# Patient Record
Sex: Male | Born: 1945 | Race: White | Hispanic: No | Marital: Married | Smoking: Never smoker
Health system: Southern US, Community
[De-identification: ages and names within clinical notes are randomized; demographics above are authoritative.]

## PROBLEM LIST (undated history)

## (undated) DIAGNOSIS — I219 Acute myocardial infarction, unspecified: Secondary | ICD-10-CM

## (undated) DIAGNOSIS — E7800 Pure hypercholesterolemia, unspecified: Secondary | ICD-10-CM

## (undated) DIAGNOSIS — M1990 Unspecified osteoarthritis, unspecified site: Secondary | ICD-10-CM

## (undated) DIAGNOSIS — I10 Essential (primary) hypertension: Secondary | ICD-10-CM

## (undated) DIAGNOSIS — T4145XA Adverse effect of unspecified anesthetic, initial encounter: Secondary | ICD-10-CM

## (undated) DIAGNOSIS — T8859XA Other complications of anesthesia, initial encounter: Secondary | ICD-10-CM

## (undated) DIAGNOSIS — G4730 Sleep apnea, unspecified: Secondary | ICD-10-CM

## (undated) DIAGNOSIS — E119 Type 2 diabetes mellitus without complications: Secondary | ICD-10-CM

## (undated) HISTORY — PX: TOTAL HIP ARTHROPLASTY: SHX124

## (undated) HISTORY — PX: WISDOM TOOTH EXTRACTION: SHX21

---

## 1988-04-15 DIAGNOSIS — I219 Acute myocardial infarction, unspecified: Secondary | ICD-10-CM

## 1988-04-15 HISTORY — DX: Acute myocardial infarction, unspecified: I21.9

## 1988-05-16 HISTORY — PX: CORONARY ARTERY BYPASS GRAFT: SHX141

## 1998-04-15 HISTORY — PX: CATARACT EXTRACTION: SUR2

## 2007-09-21 ENCOUNTER — Ambulatory Visit: Payer: Self-pay | Admitting: General Practice

## 2007-10-05 ENCOUNTER — Inpatient Hospital Stay: Payer: Self-pay | Admitting: General Practice

## 2014-08-20 ENCOUNTER — Emergency Department
Admission: EM | Admit: 2014-08-20 | Discharge: 2014-08-20 | Disposition: A | Discharge status: Home / Self Care | Payer: Medicare Other | Attending: Emergency Medicine | Admitting: Emergency Medicine

## 2014-08-20 ENCOUNTER — Emergency Department: Payer: Medicare Other

## 2014-08-20 ENCOUNTER — Encounter: Payer: Self-pay | Admitting: Emergency Medicine

## 2014-08-20 DIAGNOSIS — Y92009 Unspecified place in unspecified non-institutional (private) residence as the place of occurrence of the external cause: Secondary | ICD-10-CM | POA: Insufficient documentation

## 2014-08-20 DIAGNOSIS — Y998 Other external cause status: Secondary | ICD-10-CM | POA: Insufficient documentation

## 2014-08-20 DIAGNOSIS — S42112A Displaced fracture of body of scapula, left shoulder, initial encounter for closed fracture: Secondary | ICD-10-CM | POA: Insufficient documentation

## 2014-08-20 DIAGNOSIS — S0990XA Unspecified injury of head, initial encounter: Secondary | ICD-10-CM | POA: Diagnosis present

## 2014-08-20 DIAGNOSIS — W06XXXA Fall from bed, initial encounter: Secondary | ICD-10-CM | POA: Diagnosis not present

## 2014-08-20 DIAGNOSIS — I10 Essential (primary) hypertension: Secondary | ICD-10-CM | POA: Diagnosis not present

## 2014-08-20 DIAGNOSIS — S0101XA Laceration without foreign body of scalp, initial encounter: Secondary | ICD-10-CM | POA: Diagnosis not present

## 2014-08-20 DIAGNOSIS — Y9389 Activity, other specified: Secondary | ICD-10-CM | POA: Diagnosis not present

## 2014-08-20 DIAGNOSIS — S0093XA Contusion of unspecified part of head, initial encounter: Secondary | ICD-10-CM

## 2014-08-20 DIAGNOSIS — E119 Type 2 diabetes mellitus without complications: Secondary | ICD-10-CM | POA: Diagnosis not present

## 2014-08-20 DIAGNOSIS — S0001XA Abrasion of scalp, initial encounter: Secondary | ICD-10-CM

## 2014-08-20 DIAGNOSIS — S42101A Fracture of unspecified part of scapula, right shoulder, initial encounter for closed fracture: Secondary | ICD-10-CM

## 2014-08-20 HISTORY — DX: Type 2 diabetes mellitus without complications: E11.9

## 2014-08-20 HISTORY — DX: Pure hypercholesterolemia, unspecified: E78.00

## 2014-08-20 HISTORY — DX: Essential (primary) hypertension: I10

## 2014-08-20 MED ORDER — OXYCODONE-ACETAMINOPHEN 5-325 MG PO TABS
ORAL_TABLET | ORAL | Status: AC
  Filled 2014-08-20: qty 1

## 2014-08-20 MED ORDER — ONDANSETRON 4 MG PO TBDP
ORAL_TABLET | ORAL | Status: AC
  Filled 2014-08-20: qty 1

## 2014-08-20 MED ORDER — ONDANSETRON 4 MG PO TBDP
4.0000 mg | ORAL_TABLET | Freq: Once | ORAL | Status: AC
  Administered 2014-08-20: 4 mg via ORAL

## 2014-08-20 MED ORDER — OXYCODONE-ACETAMINOPHEN 5-325 MG PO TABS: 1.0000 | ORAL_TABLET | Freq: Four times a day (QID) | ORAL | Status: AC | PRN

## 2014-08-20 MED ORDER — OXYCODONE-ACETAMINOPHEN 5-325 MG PO TABS
1.0000 | ORAL_TABLET | Freq: Once | ORAL | Status: AC
  Administered 2014-08-20: 1 via ORAL

## 2014-08-20 NOTE — ED Notes (Signed)
Patient presents to the ED post fall.  Patient states he was standing on a toolbox in a truck bed and he fell into the truck bed and then off the truck bed and onto the concrete of his driveway.  Patient is unsure of whether or not he passed out.  Patient hit the back of his head and there is a small amount of blood and swelling to area.  Patient is complaining of pain in his left shoulder.  Patient has abrasion to hand and left leg.  Sensation and movement is intact in left arm.  Patient is alert and oriented x 4.  Does not take blood thinners.

## 2014-08-20 NOTE — Discharge Instructions (Signed)
Blunt Trauma You have been evaluated for injuries. You have been examined and your caregiver has not found injuries serious enough to require hospitalization. It is common to have multiple bruises and sore muscles following an accident. These tend to feel worse for the first 24 hours. You will feel more stiffness and soreness over the next several hours and worse when you wake up the first morning after your accident. After this point, you should begin to improve with each passing day. The amount of improvement depends on the amount of damage done in the accident. Following your accident, if some part of your body does not work as it should, or if the pain in any area continues to increase, you should return to the Emergency Department for re-evaluation.  HOME CARE INSTRUCTIONS  Routine care for sore areas should include:  Ice to sore areas every 2 hours for 20 minutes while awake for the next 2 days.  Drink extra fluids (not alcohol).  Take a hot or warm shower or bath once or twice a day to increase blood flow to sore muscles. This will help you "limber up".  Activity as tolerated. Lifting may aggravate neck or back pain.  Only take over-the-counter or prescription medicines for pain, discomfort, or fever as directed by your caregiver. Do not use aspirin. This may increase bruising or increase bleeding if there are small areas where this is happening. SEEK IMMEDIATE MEDICAL CARE IF:  Numbness, tingling, weakness, or problem with the use of your arms or legs.  A severe headache is not relieved with medications.  There is a change in bowel or bladder control.  Increasing pain in any areas of the body.  Short of breath or dizzy.  Nauseated, vomiting, or sweating.  Increasing belly (abdominal) discomfort.  Blood in urine, stool, or vomiting blood.  Pain in either shoulder in an area where a shoulder strap would be.  Feelings of lightheadedness or if you have a fainting  episode. Sometimes it is not possible to identify all injuries immediately after the trauma. It is important that you continue to monitor your condition after the emergency department visit. If you feel you are not improving, or improving more slowly than should be expected, call your physician. If you feel your symptoms (problems) are worsening, return to the Emergency Department immediately. Document Released: 12/26/2000 Document Revised: 06/24/2011 Document Reviewed: 11/18/2007 [facility]Exit[facility] Patient Information 2015 [place], [place]. This information is not intended to replace advice given to you by your health care provider. Make sure you discuss any questions you have with your health care provider.  Scapular Fracture You have a fracture (break in bone) of your scapula. This is your shoulder blade. It is the large flat bone behind your shoulder. This is also the bone that makes up the ball and socket joint of your shoulder. Most of the time surgery is not required for injuries to this bone unless the socket of the shoulder joint is involved. DIAGNOSIS  Because of the severity of force usually required to break this bone, x-rays are often taken of other bones likely to be injured at the same time. X-rays of the hip, knee, and pelvis may be taken. Specialized x-rays (arteriograms) may be needed if there are injuries to large blood vessels associated with this injury. HOME CARE INSTRUCTIONS   Simple fractures of the scapula can be treated with a sling and swathe type of immobilization. This means the involved area is held in place by putting the arm in  a sling. A wrap is made around the upper arm with the sling holding the arm next to the chest. This may be removed for bathing as instructed by your caregiver.  Apply ice to the injury for 15-20 minutes 03-04 times per day. Put the ice in a plastic bag. Place a towel between the bag of ice and your skin, splint, or immobilization device.  Do not resume use  until instructed by your caregiver. Usually full rehabilitation (exercises to improve the injury site) will begin sometime after the sling and swathe are removed. Then begin use gradually as directed. Do not increase use to the point of pain. If pain develops, decrease use and continue the above measures. Slowly increase activities that do not cause discomfort until you gradually achieve normal use without pain.  Only take over-the-counter or prescription medicines for pain, discomfort, or fever as directed by your caregiver. SEEK IMMEDIATE MEDICAL CARE IF:   Your pain and swelling increase and is uncontrolled with medications.  You develop new, unexplained symptoms or an increase of the symptoms which brought you to your caregiver.  You develop shortness or breath or cough up blood.  You are unable to move your arm or fingers. You develop warmth and swelling in your affected arm.  You develop an unexplained temperature. Document Released: 04/01/2005 Document Revised: 06/24/2011 Document Reviewed: 02/21/2006 [facility] Sew[facility]leyExitCare Patient Information 2015 [place]ExitCare, [place]LL[place]. This information is not intended to replace advice given to you by your health care provider. Make sure you discuss any questions you have with your health care provider.   Wear the sling while out of bed.  Follow-up with Dr. Joice LoftsPoggi next week for further care.  Take the prescription meds as directed.  Apply ice for comfort.

## 2014-08-20 NOTE — ED Provider Notes (Signed)
[facility] Emergency [facility] Provider Note ?____________________________________________ ? Time seen: 1745 ? I have reviewed the triage vital signs and the nursing notes.  ________ HISTORY ? Chief Complaint Fall    HPI  Foy Pollie MeyerMcIntyre is a 69 y.o. male who presents to the ED status post fall at home today. He describes it on a toolbox and the back of a truck bed, when he fell backwards slipping out of the truck bed and landing on the driveway. He hit the back of his head and noted immediate pain to the posterior left shoulder. He also notes some decrease movement to left arm secondary to pain. He rates pain at 10 currently.  Past Medical History  Diagnosis Date  . Hypertension   . Diabetes mellitus without complication   . High cholesterol     There are no active problems to display for this patient.  ? Past Surgical History  Procedure Laterality Date  . Cataract extraction  2000  . Total hip arthroplasty     Allergies Review of patient's allergies indicates no known allergies. ? No family history on file. ? Social History History  Substance Use Topics  . Smoking status: Never Smoker   . Smokeless tobacco: Never Used  . Alcohol Use: No    Review of Systems  Constitutional: Negative for fever. HEENT: Negative for head trauma, visual changes, sore throat. Cardiovascular: Negative for chest pain. Respiratory: Negative for shortness of breath. Musculoskeletal: Negative for back pain. Skin: Negative for rash. Neurological: Negative for headaches, focal weakness or numbness.  10-point ROS otherwise negative.  ____________________________________________   PHYSICAL EXAM:  VITAL SIGNS: ED Triage Vitals  Enc Vitals Group     BP 08/20/14 1509 139/81 mmHg     Pulse Rate 08/20/14 1509 77     Resp 08/20/14 1509 20     Temp 08/20/14 1509 98.6 F (37 C)     Temp Source 08/20/14 1509 Oral     SpO2 08/20/14 1509 100 %     Weight  08/20/14 1509 239 lb (108.41 kg)     Height 08/20/14 1509 5\' 7"  (1.702 m)     Head Cir --      Peak Flow --      Pain Score 08/20/14 1510 9     Pain Loc --      Pain Edu? --      Excl. in GC? --     Constitutional: Alert and oriented. Well appearing and in no distress. HEENT:Normocephalic and superficial abrasion noted to the left parietal scalp. Small less than half centimeter laceration noted to the scalp. PERRL. Normal extraocular movements.  No congestion/rhinnorhea. Mucous membranes are moist. Neck: Supple. No cervical lymphadenopathy. Cardiovascular: Normal rate, regular rhythm. No murmurs, rubs, or gallops. Normal and symmetric distal pulses are present in all extremities.  Respiratory: Normal respiratory effort without tachypnea. Breath sounds are clear and equal bilaterally. No wheezes/rales/rhonchi. Gastrointestinal: Soft and nontender. No distention. No abdominal bruits. There is no CVA tenderness. Musculoskeletal: Left shoulder without deformity, left posterior scapula with superficial abrasion, patient is noted to be tender to palpation over the left scapular spine. Decreased left shoulder range of motion secondary to pain. No joint effusions.  No lower extremity tenderness nor edema. Neurologic:  Normal speech and language. CN II-XII grossly intact. No gait instability. Skin:  Skin is warm, dry and intact. No rash noted. Psychiatric: Mood and affect are normal. Patient exhibits appropriate insight and judgment.  ___________  RADIOLOGY  Right Shoulder  IMPRESSION: Mildly displaced scapular body fracture. This is in the body of the scapula superior to the spine and approaches but does not clearly extend into the base of the coronoid.Glenohumeral joint appears located.  Maximal displacement measures 4 mm.  CT Head/C-spine  IMPRESSION: 1. No skull fracture or intracranial hemorrhage. 2. No cervical spine fracture or subluxation. 3. Mild diffuse cerebral atrophy. 4.  Cervical spine degenerative changes. 5. Bilateral carotid artery atheromatous calcifications. _____________ PROCEDURES ? Procedure(s) performed: None  Critical Care performed: None  ________ Current Outpatient Rx  Name  Route  Sig  Dispense  Refill  . oxyCODONE-acetaminophen (ROXICET) 5-325 MG per tablet   Oral   Take 1 tablet by mouth every 6 (six) hours as needed for moderate pain or severe pain.   20 tablet   0    ______________________________________________ INITIAL IMPRESSION / ASSESSMENT AND PLAN / ED COURSE ? Radiology results to patient & family.  Discussed follow-up care plan with Dr. Joice LoftsPoggi.  Will see patient in the office next week for non-surgical management.  Pertinent labs & imaging results that were available during my care of the patient were reviewed by me and considered in my medical decision making (see chart for details).   ____________________________________________ FINAL CLINICAL IMPRESSION(S) / ED DIAGNOSES?  Final diagnoses:  Scapula fracture, right, closed, initial encounter  Head contusion, initial encounter  Scalp abrasion, initial encounter      Lissa HoardJenise V Bacon Mikaia Janvier, PA-C 08/21/14 0047  Governor Rooksebecca Lord, MD 08/24/14 1258

## 2014-08-26 ENCOUNTER — Other Ambulatory Visit: Payer: Self-pay | Admitting: Orthopedic Surgery

## 2014-08-26 DIAGNOSIS — R29898 Other symptoms and signs involving the musculoskeletal system: Secondary | ICD-10-CM

## 2014-08-30 ENCOUNTER — Ambulatory Visit
Admission: RE | Admit: 2014-08-30 | Discharge: 2014-08-30 | Disposition: A | Discharge status: Home / Self Care | Payer: Medicare Other | Source: Ambulatory Visit | Attending: Orthopedic Surgery | Admitting: Orthopedic Surgery

## 2014-08-30 DIAGNOSIS — M75122 Complete rotator cuff tear or rupture of left shoulder, not specified as traumatic: Secondary | ICD-10-CM | POA: Insufficient documentation

## 2014-08-30 DIAGNOSIS — X58XXXA Exposure to other specified factors, initial encounter: Secondary | ICD-10-CM | POA: Insufficient documentation

## 2014-08-30 DIAGNOSIS — R29898 Other symptoms and signs involving the musculoskeletal system: Secondary | ICD-10-CM

## 2014-08-30 DIAGNOSIS — M6281 Muscle weakness (generalized): Secondary | ICD-10-CM | POA: Diagnosis present

## 2014-08-30 DIAGNOSIS — S4292XA Fracture of left shoulder girdle, part unspecified, initial encounter for closed fracture: Secondary | ICD-10-CM | POA: Insufficient documentation

## 2014-10-04 ENCOUNTER — Encounter: Payer: Self-pay | Admitting: *Deleted

## 2014-10-12 ENCOUNTER — Ambulatory Visit: Payer: Medicare Other | Admitting: Anesthesiology

## 2014-10-12 ENCOUNTER — Ambulatory Visit: Admission: RE | Admit: 2014-10-12 | Payer: Medicare Other | Source: Ambulatory Visit

## 2014-10-12 ENCOUNTER — Ambulatory Visit
Admission: RE | Admit: 2014-10-12 | Discharge: 2014-10-12 | Disposition: A | Discharge status: Home / Self Care | Payer: Medicare Other | Source: Ambulatory Visit | Attending: Surgery | Admitting: Surgery

## 2014-10-12 ENCOUNTER — Encounter
Admission: RE | Disposition: A | Discharge status: Home / Self Care | Payer: Self-pay | Source: Ambulatory Visit | Attending: Surgery

## 2014-10-12 DIAGNOSIS — M7542 Impingement syndrome of left shoulder: Secondary | ICD-10-CM | POA: Insufficient documentation

## 2014-10-12 DIAGNOSIS — Z9849 Cataract extraction status, unspecified eye: Secondary | ICD-10-CM | POA: Diagnosis not present

## 2014-10-12 DIAGNOSIS — Z833 Family history of diabetes mellitus: Secondary | ICD-10-CM | POA: Diagnosis not present

## 2014-10-12 DIAGNOSIS — M75102 Unspecified rotator cuff tear or rupture of left shoulder, not specified as traumatic: Secondary | ICD-10-CM | POA: Insufficient documentation

## 2014-10-12 DIAGNOSIS — I252 Old myocardial infarction: Secondary | ICD-10-CM | POA: Insufficient documentation

## 2014-10-12 DIAGNOSIS — M7522 Bicipital tendinitis, left shoulder: Secondary | ICD-10-CM | POA: Insufficient documentation

## 2014-10-12 DIAGNOSIS — I2510 Atherosclerotic heart disease of native coronary artery without angina pectoris: Secondary | ICD-10-CM | POA: Diagnosis not present

## 2014-10-12 DIAGNOSIS — Z96641 Presence of right artificial hip joint: Secondary | ICD-10-CM | POA: Diagnosis not present

## 2014-10-12 DIAGNOSIS — Z8249 Family history of ischemic heart disease and other diseases of the circulatory system: Secondary | ICD-10-CM | POA: Diagnosis not present

## 2014-10-12 DIAGNOSIS — Z951 Presence of aortocoronary bypass graft: Secondary | ICD-10-CM | POA: Diagnosis not present

## 2014-10-12 DIAGNOSIS — Z79899 Other long term (current) drug therapy: Secondary | ICD-10-CM | POA: Insufficient documentation

## 2014-10-12 DIAGNOSIS — M19012 Primary osteoarthritis, left shoulder: Secondary | ICD-10-CM | POA: Diagnosis not present

## 2014-10-12 DIAGNOSIS — W010XXD Fall on same level from slipping, tripping and stumbling without subsequent striking against object, subsequent encounter: Secondary | ICD-10-CM | POA: Diagnosis not present

## 2014-10-12 DIAGNOSIS — E119 Type 2 diabetes mellitus without complications: Secondary | ICD-10-CM | POA: Insufficient documentation

## 2014-10-12 HISTORY — DX: Sleep apnea, unspecified: G47.30

## 2014-10-12 HISTORY — DX: Unspecified osteoarthritis, unspecified site: M19.90

## 2014-10-12 HISTORY — DX: Hemochromatosis, unspecified: E83.119

## 2014-10-12 HISTORY — DX: Adverse effect of unspecified anesthetic, initial encounter: T41.45XA

## 2014-10-12 HISTORY — DX: Acute myocardial infarction, unspecified: I21.9

## 2014-10-12 HISTORY — DX: Other complications of anesthesia, initial encounter: T88.59XA

## 2014-10-12 HISTORY — PX: SHOULDER ARTHROSCOPY WITH ROTATOR CUFF REPAIR AND SUBACROMIAL DECOMPRESSION: SHX5686

## 2014-10-12 LAB — GLUCOSE, CAPILLARY
Glucose-Capillary: 109 mg/dL — ABNORMAL HIGH (ref 65–99)
Glucose-Capillary: 92 mg/dL (ref 65–99)

## 2014-10-12 SURGERY — SHOULDER ARTHROSCOPY WITH ROTATOR CUFF REPAIR AND SUBACROMIAL DECOMPRESSION
Anesthesia: Regional | Laterality: Left | Wound class: Clean

## 2014-10-12 SURGERY — Surgical Case
Anesthesia: *Unknown

## 2014-10-12 MED ORDER — MIDAZOLAM HCL 2 MG/2ML IJ SOLN
INTRAMUSCULAR | Status: DC | PRN
  Administered 2014-10-12: 2 mg via INTRAVENOUS

## 2014-10-12 MED ORDER — BUPIVACAINE-EPINEPHRINE (PF) 0.5% -1:200000 IJ SOLN
INTRAMUSCULAR | Status: DC | PRN
  Administered 2014-10-12: 30 mL

## 2014-10-12 MED ORDER — ONDANSETRON HCL 4 MG PO TABS: 4.0000 mg | ORAL_TABLET | Freq: Four times a day (QID) | ORAL | Status: DC | PRN

## 2014-10-12 MED ORDER — LACTATED RINGERS IV SOLN
INTRAVENOUS | Status: DC
  Administered 2014-10-12 (×2): via INTRAVENOUS

## 2014-10-12 MED ORDER — METOCLOPRAMIDE HCL 5 MG PO TABS: 5.0000 mg | ORAL_TABLET | Freq: Three times a day (TID) | ORAL | Status: DC | PRN

## 2014-10-12 MED ORDER — DEXAMETHASONE SODIUM PHOSPHATE 4 MG/ML IJ SOLN
INTRAMUSCULAR | Status: DC | PRN
  Administered 2014-10-12: 8 mg via INTRAVENOUS

## 2014-10-12 MED ORDER — METOCLOPRAMIDE HCL 5 MG/ML IJ SOLN: 5.0000 mg | Freq: Three times a day (TID) | INTRAMUSCULAR | Status: DC | PRN

## 2014-10-12 MED ORDER — FENTANYL CITRATE (PF) 100 MCG/2ML IJ SOLN
INTRAMUSCULAR | Status: DC | PRN
  Administered 2014-10-12: 100 ug via INTRAVENOUS

## 2014-10-12 MED ORDER — SCOPOLAMINE 1 MG/3DAYS TD PT72
MEDICATED_PATCH | TRANSDERMAL | Status: DC | PRN
  Administered 2014-10-12: 1 via TRANSDERMAL

## 2014-10-12 MED ORDER — ONDANSETRON HCL 4 MG/2ML IJ SOLN: 4.0000 mg | Freq: Four times a day (QID) | INTRAMUSCULAR | Status: DC | PRN

## 2014-10-12 MED ORDER — OXYCODONE HCL 5 MG PO TABS: 5.0000 mg | ORAL_TABLET | ORAL | Status: DC | PRN

## 2014-10-12 MED ORDER — ONDANSETRON HCL 4 MG/2ML IJ SOLN
INTRAMUSCULAR | Status: DC | PRN
  Administered 2014-10-12: 4 mg via INTRAVENOUS

## 2014-10-12 MED ORDER — CEFAZOLIN SODIUM-DEXTROSE 2-3 GM-% IV SOLR
2.0000 g | Freq: Once | INTRAVENOUS | Status: AC
  Administered 2014-10-12: 2 g via INTRAVENOUS

## 2014-10-12 MED ORDER — LACTATED RINGERS IV SOLN
INTRAVENOUS | Status: DC | PRN
  Administered 2014-10-12: 1600 mL

## 2014-10-12 MED ORDER — LIDOCAINE HCL (CARDIAC) 20 MG/ML IV SOLN
INTRAVENOUS | Status: DC | PRN
  Administered 2014-10-12: 40 mg via INTRATRACHEAL

## 2014-10-12 MED ORDER — POTASSIUM CHLORIDE IN NACL 20-0.9 MEQ/L-% IV SOLN: INTRAVENOUS | Status: DC

## 2014-10-12 MED ORDER — PROPOFOL 10 MG/ML IV BOLUS
INTRAVENOUS | Status: DC | PRN
  Administered 2014-10-12: 150 mg via INTRAVENOUS

## 2014-10-12 SURGICAL SUPPLY — 41 items
ADAPTER IRRIG TUBE 2 SPIKE SOL (ADAPTER) IMPLANT
ANCHOR JUGGERKNOT WTAP NDL 2.9 (Anchor) IMPLANT
BIT DRILL JUGRKNT W/NDL BIT2.9 (DRILL) ×1 IMPLANT
BLADE FULL RADIUS 3.5 (BLADE) ×3 IMPLANT
BLADE SHAVER 4.5X7 STR FR (MISCELLANEOUS) IMPLANT
BUR ACROMIONIZER 4.0 (BURR) IMPLANT
BUR BR 5.5 WIDE MOUTH (BURR) IMPLANT
CHLORAPREP W/TINT 26ML (MISCELLANEOUS) ×6 IMPLANT
COVER LIGHT HANDLE UNIVERSAL (MISCELLANEOUS) ×6 IMPLANT
DRAPE SURG 17X11 SM STRL (DRAPES) ×3 IMPLANT
DRILL JUGGERKNOT W/NDL BIT 2.9 (DRILL) ×3
GAUZE PETRO XEROFOAM 1X8 (MISCELLANEOUS) ×3 IMPLANT
GAUZE SPONGE 4X4 12PLY STRL (GAUZE/BANDAGES/DRESSINGS) ×3 IMPLANT
GLOVE BIO SURGEON STRL SZ8 (GLOVE) ×9 IMPLANT
GLOVE INDICATOR 8.0 STRL GRN (GLOVE) ×3 IMPLANT
GOWN STRL REUS W/ TWL LRG LVL3 (GOWN DISPOSABLE) ×2 IMPLANT
GOWN STRL REUS W/ TWL XL LVL3 (GOWN DISPOSABLE) ×1 IMPLANT
GOWN STRL REUS W/TWL LRG LVL3 (GOWN DISPOSABLE) ×4
GOWN STRL REUS W/TWL XL LVL3 (GOWN DISPOSABLE) ×2
IV LACTATED RINGER IRRG 3000ML (IV SOLUTION) ×2
IV LR IRRIG 3000ML ARTHROMATIC (IV SOLUTION) ×1 IMPLANT
JUGGERKNOT SOFT ANCHOR ×9 IMPLANT
KIT CANNULA 8X76-LX IN CANNULA (CANNULA) ×3 IMPLANT
MANIFOLD 4PT FOR NEPTUNE1 (MISCELLANEOUS) ×3 IMPLANT
MAT BLUE FLOOR 46X72 FLO (MISCELLANEOUS) ×3 IMPLANT
NDL MAYO CATGUT SZ5 (NEEDLE)
NDL SUT 5 .5 CRC TPR PNT MAYO (NEEDLE) IMPLANT
NEEDLE HYPO 21X1.5 SAFETY (NEEDLE) ×6 IMPLANT
PACK ARTHROSCOPY KNEE (MISCELLANEOUS) ×3 IMPLANT
PAD GROUND ADULT SPLIT (MISCELLANEOUS) IMPLANT
SLING ULTRA II LG (MISCELLANEOUS) ×3 IMPLANT
STAPLER SKIN PROX 35W (STAPLE) ×3 IMPLANT
STRAP BODY AND KNEE 60X3 (MISCELLANEOUS) ×6 IMPLANT
SUT ETHIBOND 0 MO6 C/R (SUTURE) ×3 IMPLANT
SUT VIC AB 2-0 CT1 27 (SUTURE) ×4
SUT VIC AB 2-0 CT1 TAPERPNT 27 (SUTURE) ×2 IMPLANT
SYR 30ML LL (SYRINGE) ×3 IMPLANT
TAPE MICROFOAM 4IN (TAPE) ×3 IMPLANT
TUBING ARTHRO INFLOW-ONLY STRL (TUBING) ×3 IMPLANT
WAND 90 DEG TURBOVAC W/CORD (SURGICAL WAND) ×3 IMPLANT
WAND HAND CNTRL MULTIVAC 90 (MISCELLANEOUS) ×3 IMPLANT

## 2014-10-12 NOTE — Op Note (Signed)
10/12/2014  3:03 PM  Patient:   Wayne Combs  Pre-Op Diagnosis:   Impingement/tendinopathy with rotator cuff tear, left shoulder.  Postoperative diagnosis: Impingement/tendinopathy with rotator cuff tear, labral fraying, early degenerative joint disease, and biceps tendinopathy, left shoulder.  Procedure: Limited arthroscopic debridement, arthroscopic subacromial decompression, mini-open rotator cuff repair, and mini-open biceps tenodesis, left shoulder.  Anesthesia: General LMA with interscalene block placed preoperatively by the anesthesiologist.  Surgeon:   Maryagnes AmosJ. Jeffrey Mianna Iezzi, MD  Assistant:   Horris LatinoLance McGhee, PA-C  Findings: As above. There was a full-thickness tear of the rotator cuff measuring approximately 2 x 2.5 cm involving the insertional fibers of the supraspinatus. There was a small articular surface tear of the superior insertional fibers of the subscapularis without retraction and involving less than 10% of the footprint. 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 There was significant fraying of the labrum anteriorly and superiorly without clear detachment.  Complications: None  Fluids:   1000 cc  Estimated blood loss: 50 cc  Tourniquet time: None  Drains: None  Closure: Staples   Brief clinical note: The patient is a 69 year old male with a 6 week history of left shoulder pain following an accident in which he tripped while taking tools of the back of his pickup and fell backwards over the tailgate and to land on the posterior lateral aspect of his left shoulder. Initial x-rays suggested a scapular fracture. Because of inability to elevate the arm, an MRI was obtained which demonstrated the presence of a full-thickness tear of the rotator cuff.. The patient's symptoms have persisted despite medications, activity modification, etc. The patient presents at this time for  definitive management of these shoulder symptoms.  Procedure: The patient underwent placement of an interscalene block the preoperative holding area before he was brought into the operating room and lain in the supine position. He underwent general laryngal mask intubation and anesthesia before being repositioned in the beach chair position using the beach chair position. The left shoulder and upper extremity were prepped with ChloraPrep solution before being draped sterilely. Preoperative antibiotics were administered. A timeout was performed to confirm the proper side was prepped before the expected portal sites and incision site were injected with 0.5% Sensorcaine with epinephrine. A posterior portal was created and the glenohumeral joint thoroughly inspected with the findings as described above. An anterior portal was created using an outside-in technique. The labrum and rotator cuff were further probed, again confirming the above-noted findings. The areas of labral fraying were debrided back to stable margins using the full-radius resector. In addition, some areas of synovitis, as well as the frayed margins of the rotator cuff also were debrided using the full-radius resector. The ArthroCare wand was inserted and used to obtain hemostasis as well as to "anneal" the labrum superiorly and anteriorly. The instruments were removed from the joint after suctioning the excess fluid.  The camera was repositioned through the posterior portal into the subacromial space. A separate lateral portal was created using an outside-in technique. The 3.5 full-radius resector was introduced and used to perform a subtotal bursectomy. The ArthroCare wand was then inserted and used to remove the periosteal tissue off the undersurface of the anterior third of the acromion as well as to recess the coracoacromial ligament from its attachment along the anterior and lateral margins of the acromion. The 4.0 mm acromionizing bur was  introduced and used to complete the decompression by removing the undersurface  of the anterior third of the acromion. The full radius resector was reintroduced to remove any residual bony debris before the ArthroCare wand was reintroduced to obtain hemostasis. The instruments were then removed from the subacromial space after suctioning the excess fluid.  An approximately 4-5 cm incision was made over the anterolateral aspect of the shoulder beginning at the anterolateral corner of the acromion and extending distally in line with the bicipital groove. This incision was carried down through the subcutaneous tissues to expose the deltoid fascia. The raphae between the anterior and middle thirds was identified and this plane developed to provide access into the subacromial space. Additional bursal tissues were debrided sharply using Metzenbaum scissors. The rotator cuff tear was readily identified. The margins were debrided sharply with a #15 blade and the exposed greater tuberosity roughened with a rongeur. The tear was repaired using two Biomet 2.9 mm JuggerKnot anchors. The longitudinal component of the tear was reapproximated using #0 Ethibond interrupted sutures. These were tied prior to tying the more lateral sutures from the JuggerKnot anchors. An apparent watertight closure was obtained.  The bicipital groove was identified by palpation and opened for 1-1.5 cm. The biceps tendon stump was retrieved through this defect. The floor of the bicipital groove was roughened with a curette before another Biomet 2.9 mm JuggerKnot anchor was inserted. Both sets of sutures were passed through the biceps tendon to effect the tenodesis. The bicipital sheath was reapproximated using two #0 Ethibond interrupted sutures, incorporating the biceps tendon to further reinforce the tenodesis.  The wound was copiously irrigated with sterile saline solution before the deltoid raphae was reapproximated using 2-0 Vicryl  interrupted sutures. The subcutaneous tissues were closed in two layers using 2-0 Vicryl interrupted sutures before the skin was closed using staples. The portal sites also were closed using staples. A sterile bulky dressing was applied to the shoulder before the arm was placed into a shoulder immobilizer. The patient was then awakened, extubated, and returned to the recovery room in satisfactory condition after tolerating the procedure well.

## 2014-10-12 NOTE — Progress Notes (Signed)
Assisted Dorna Leitzhristopher Gratian ANMD with left, interscalene block. Side rails up, monitors on throughout procedure. See vital signs in flow sheet. Tolerated Procedure well.

## 2014-10-12 NOTE — Discharge Instructions (Signed)
General Anesthesia, Care After °Refer to this sheet in the next few weeks. These instructions provide you with information on caring for yourself after your procedure. Your health care provider may also give you more specific instructions. Your treatment has been planned according to current medical practices, but problems sometimes occur. Call your health care provider if you have any problems or questions after your procedure. °WHAT TO EXPECT AFTER THE PROCEDURE °After the procedure, it is typical to experience: °· Sleepiness. °· Nausea and vomiting. °HOME CARE INSTRUCTIONS °· For the first 24 hours after general anesthesia: °¨ Have a responsible person with you. °¨ Do not drive a car. If you are alone, do not take public transportation. °¨ Do not drink alcohol. °¨ Do not take medicine that has not been prescribed by your health care provider. °¨ Do not sign important papers or make important decisions. °¨ You may resume a normal diet and activities as directed by your health care provider. °· Change bandages (dressings) as directed. °· If you have questions or problems that seem related to general anesthesia, call the hospital and ask for the anesthetist or anesthesiologist on call. °SEEK MEDICAL CARE IF: °· You have nausea and vomiting that continue the day after anesthesia. °· You develop a rash. °SEEK IMMEDIATE MEDICAL CARE IF:  °· You have difficulty breathing. °· You have chest pain. °· You have any allergic problems. °Document Released: 07/08/2000 Document Revised: 04/06/2013 Document Reviewed: 10/15/2012 °ExitCare® Patient Information ©2015 [employer], [employer]. This information is not intended to replace advice given to you by your health care provider. Make sure you discuss any questions you have with your health care provider. ° °Keep dressing dry and intact.  °May shower after dressing changed on post-op day #4 (Sunday).  °Cover staples/sutures with Band-Aids after drying off. °Apply ice frequently to  shoulder. °Keep shoulder immobilizer on at all times except may remove for bathing purposes. °Follow-up in 10-14 days or as scheduled. °

## 2014-10-12 NOTE — Anesthesia Preprocedure Evaluation (Signed)
Anesthesia Evaluation    Airway Mallampati: II  TM Distance: >3 FB Neck ROM: Full    Dental no notable dental hx.    Pulmonary  breath sounds clear to auscultation  Pulmonary exam normal       Cardiovascular hypertension, Normal cardiovascular examRhythm:Regular Rate:Normal     Neuro/Psych    GI/Hepatic   Endo/Other  diabetes  Renal/GU      Musculoskeletal   Abdominal   Peds  Hematology   Anesthesia Other Findings   Reproductive/Obstetrics                             Anesthesia Physical Anesthesia Plan  ASA: II  Anesthesia Plan: General and Regional   Post-op Pain Management:    Induction: Intravenous  Airway Management Planned: LMA  Additional Equipment:   Intra-op Plan:   Post-operative Plan: Extubation in OR  Informed Consent: I have reviewed the patients History and Physical, chart, labs and discussed the procedure including the risks, benefits and alternatives for the proposed anesthesia with the patient or authorized representative who has indicated his/her understanding and acceptance.   Dental advisory given  Plan Discussed with: CRNA  Anesthesia Plan Comments:         Anesthesia Quick Evaluation

## 2014-10-12 NOTE — Anesthesia Procedure Notes (Addendum)
Anesthesia Regional Block:  Interscalene brachial plexus block  Pre-Anesthetic Checklist: ,, timeout performed, Correct Patient, Correct Site, Correct Laterality, Correct Procedure, Correct Position, site marked, Risks and benefits discussed,  Surgical consent,  Pre-op evaluation,  At surgeon's request and post-op pain management  Laterality: Left  Prep: chloraprep       Needles:  Injection technique: Single-shot  Needle Type: Stimiplex     Needle Length: 10cm 10 cm Needle Gauge: 21 and 21 G  Needle insertion depth: 3 cm   Additional Needles:  Procedures: ultrasound guided (picture in chart) and nerve stimulator Interscalene brachial plexus block  Nerve Stimulator or Paresthesia:  Response: 0.5 mA,   Additional Responses:   Narrative:  Start time: 10/12/2014 12:20 PM End time: 10/12/2014 12:33 PM Injection made incrementally with aspirations every 5 mL.  Performed by: Personally   Additional Notes: Functioning IV was confirmed and monitors applied. Ultrasound guidance: relevant anatomy identified, needle position confirmed, local anesthetic spread visualized around nerve(s)., vascular puncture avoided.  Image printed for medical record.  Negative aspiration and no paresthesias; incremental administration of local anesthetic. The patient tolerated the procedure well. Vitals signes recorded in RN notes.   Procedure Name: LMA Insertion Date/Time: 10/12/2014 12:47 PM Performed by: Andee PolesBUSH, Philicia Heyne Pre-anesthesia Checklist: Patient identified, Emergency Drugs available, Suction available, Timeout performed and Patient being monitored Patient Re-evaluated:Patient Re-evaluated prior to inductionOxygen Delivery Method: Circle system utilized Preoxygenation: Pre-oxygenation with 100% oxygen Intubation Type: IV induction LMA: LMA inserted LMA Size: 5.0 Number of attempts: 1 Placement Confirmation: positive ETCO2 and breath sounds checked- equal and bilateral Tube secured with:  Tape

## 2014-10-12 NOTE — H&P (Signed)
Paper H&P to be scanned into permanent record. H&P reviewed. No changes. 

## 2014-10-12 NOTE — Transfer of Care (Signed)
Immediate Anesthesia Transfer of Care Note  Patient: Wayne Combs  Procedure(s) Performed: Procedure(s): SHOULDER ARTHROSCOPY WITH ROTATOR CUFF REPAIR AND DEBRIEDMENT & DECOMPRESSION (Left)  Patient Location: PACU  Anesthesia Type: No value filed.  Level of Consciousness: awake, alert  and patient cooperative  Airway and Oxygen Therapy: Patient Spontanous Breathing and Patient connected to supplemental oxygen  Post-op Assessment: Post-op Vital signs reviewed, Patient's Cardiovascular Status Stable, Respiratory Function Stable, Patent Airway and No signs of Nausea or vomiting  Post-op Vital Signs: Reviewed and stable  Complications: No apparent anesthesia complications

## 2014-10-12 NOTE — Anesthesia Postprocedure Evaluation (Signed)
  Anesthesia Post-op Note  Patient: Wayne Combs  Procedure(s) Performed: Procedure(s): SHOULDER ARTHROSCOPY WITH ROTATOR CUFF REPAIR AND DEBRIEDMENT & DECOMPRESSION (Left)  Anesthesia type:No value filed.  Patient location: PACU  Post pain: Pain level controlled  Post assessment: Post-op Vital signs reviewed, Patient's Cardiovascular Status Stable, Respiratory Function Stable, Patent Airway and No signs of Nausea or vomiting  Post vital signs: Reviewed and stable  Last Vitals:  Filed Vitals:   10/12/14 1515  BP:   Pulse: 68  Temp:   Resp: 18    Level of consciousness: awake, alert  and patient cooperative  Complications: No apparent anesthesia complications

## 2014-10-13 ENCOUNTER — Encounter: Payer: Self-pay | Admitting: Surgery

## 2017-01-17 IMAGING — CT CT CERVICAL SPINE W/O CM
1 of 6 series · 3 of 14 positions shown, 4 images · non-contrast
Comparison: None.

CLINICAL DATA: Headache and neck pain after falling off the bed of
a struck onto the driveway.

EXAM:
CT HEAD WITHOUT CONTRAST
CT CERVICAL SPINE WITHOUT CONTRAST
TECHNIQUE: Multidetector CT imaging of the head and cervical spine was
performed following the standard protocol without intravenous
contrast. Multiplanar CT image reconstructions of the cervical spine
were also generated.

[Series 5: c spine soft · axial · 0.35mm/px · z∈[-330,-178]mm · 3 of 77 slices shown, 4 images]
[im 1/77  soft-tissue]
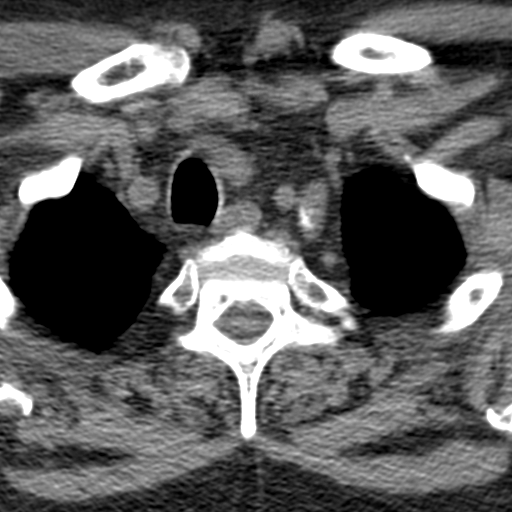
[im 1/77  bone]
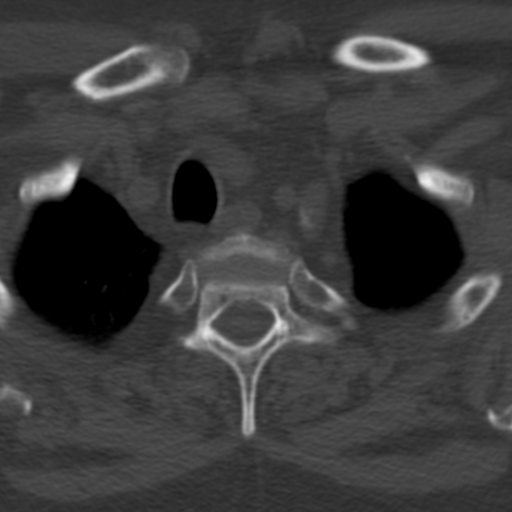
[im 39/77  bone]
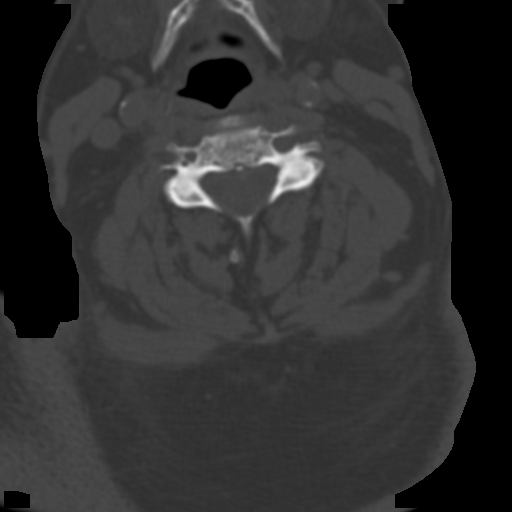
[im 77/77  bone]
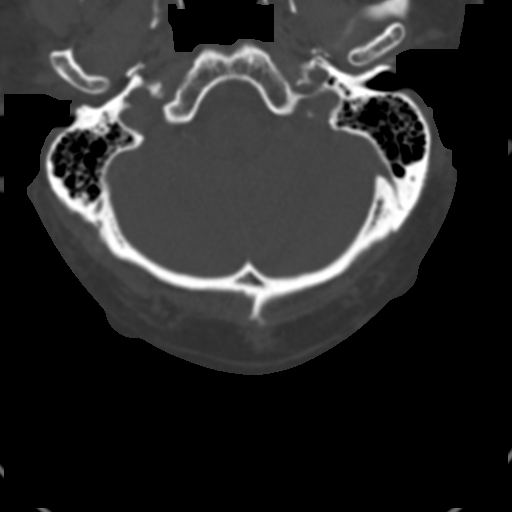

[3 of 14 positions shown; findings below may reference images not displayed]

FINDINGS: CT HEAD FINDINGS

Diffusely enlarged ventricles and subarachnoid spaces. No skull
fracture, intracranial hemorrhage or paranasal sinus air-fluid
levels.

CT CERVICAL SPINE FINDINGS

Multilevel degenerative changes. No prevertebral soft tissue
swelling, fractures or subluxations. Bilateral carotid artery
calcifications.
IMPRESSION: 1. No skull fracture or intracranial hemorrhage.
2. No cervical spine fracture or subluxation.
3. Mild diffuse cerebral atrophy.
4. Cervical spine degenerative changes.
5. Bilateral carotid artery atheromatous calcifications.

## 2023-12-08 ENCOUNTER — Emergency Department: Admission: EM | Admit: 2023-12-08 | Discharge: 2023-12-09 | Disposition: A | Discharge status: Home / Self Care

## 2023-12-08 ENCOUNTER — Other Ambulatory Visit: Payer: Self-pay

## 2023-12-08 ENCOUNTER — Emergency Department

## 2023-12-08 ENCOUNTER — Encounter: Payer: Self-pay | Admitting: Emergency Medicine

## 2023-12-08 DIAGNOSIS — W19XXXA Unspecified fall, initial encounter: Secondary | ICD-10-CM

## 2023-12-08 DIAGNOSIS — S62667B Nondisplaced fracture of distal phalanx of left little finger, initial encounter for open fracture: Secondary | ICD-10-CM | POA: Diagnosis not present

## 2023-12-08 DIAGNOSIS — S61317A Laceration without foreign body of left little finger with damage to nail, initial encounter: Secondary | ICD-10-CM | POA: Insufficient documentation

## 2023-12-08 DIAGNOSIS — Y92009 Unspecified place in unspecified non-institutional (private) residence as the place of occurrence of the external cause: Secondary | ICD-10-CM | POA: Insufficient documentation

## 2023-12-08 DIAGNOSIS — W01198A Fall on same level from slipping, tripping and stumbling with subsequent striking against other object, initial encounter: Secondary | ICD-10-CM | POA: Diagnosis not present

## 2023-12-08 DIAGNOSIS — S60511A Abrasion of right hand, initial encounter: Secondary | ICD-10-CM | POA: Diagnosis not present

## 2023-12-08 DIAGNOSIS — S0081XA Abrasion of other part of head, initial encounter: Secondary | ICD-10-CM | POA: Diagnosis not present

## 2023-12-08 DIAGNOSIS — I10 Essential (primary) hypertension: Secondary | ICD-10-CM | POA: Diagnosis not present

## 2023-12-08 DIAGNOSIS — Z23 Encounter for immunization: Secondary | ICD-10-CM | POA: Insufficient documentation

## 2023-12-08 DIAGNOSIS — S6992XA Unspecified injury of left wrist, hand and finger(s), initial encounter: Secondary | ICD-10-CM | POA: Diagnosis present

## 2023-12-08 DIAGNOSIS — S0990XA Unspecified injury of head, initial encounter: Secondary | ICD-10-CM | POA: Insufficient documentation

## 2023-12-08 MED ORDER — LIDOCAINE HCL (PF) 1 % IJ SOLN
5.0000 mL | Freq: Once | INTRAMUSCULAR | Status: AC
  Administered 2023-12-08: 5 mL
  Filled 2023-12-08: qty 5

## 2023-12-08 MED ORDER — ACETAMINOPHEN 500 MG PO TABS: 1000.0000 mg | ORAL_TABLET | Freq: Four times a day (QID) | ORAL | 2 refills | Status: AC | PRN

## 2023-12-08 MED ORDER — TETANUS-DIPHTH-ACELL PERTUSSIS 5-2.5-18.5 LF-MCG/0.5 IM SUSY
0.5000 mL | PREFILLED_SYRINGE | Freq: Once | INTRAMUSCULAR | Status: AC
  Administered 2023-12-08: 0.5 mL via INTRAMUSCULAR
  Filled 2023-12-08: qty 0.5

## 2023-12-08 MED ORDER — BACITRACIN ZINC 500 UNIT/GM EX OINT
TOPICAL_OINTMENT | Freq: Once | CUTANEOUS | Status: AC
  Filled 2023-12-08: qty 0.9

## 2023-12-08 MED ORDER — CEPHALEXIN 500 MG PO CAPS
500.0000 mg | ORAL_CAPSULE | Freq: Once | ORAL | Status: AC
Start: 2023-12-08 — End: 2023-12-08
  Administered 2023-12-08: 500 mg via ORAL
  Filled 2023-12-08: qty 1

## 2023-12-08 MED ORDER — CEPHALEXIN 500 MG PO CAPS: 500.0000 mg | ORAL_CAPSULE | Freq: Two times a day (BID) | ORAL | 0 refills | Status: AC

## 2023-12-08 NOTE — ED Notes (Signed)
 Patient transported to CT

## 2023-12-08 NOTE — ED Provider Notes (Addendum)
 [facility] Provider Note    Event Date/Time   First MD Initiated Contact with Patient 12/08/23 2054     (approximate)   History   Fall  Patient arrives via EMS from home for mechanical fall. Patient reports falling while taking trash can out and has laceration above right eyebrow, laceration to left pinky finger, and abrasions to right arm. Denies LOC, denies blood thinners.    HPI Wayne Combs is a 78 y.o. male PMH diabetes, hemochromatosis, hypertension, hyperlipidemia, prior MI presents for evaluation after fall - Patient states he was trying to catch up with a trash can but was moving faster than him, lost balance and fell.  Hit his head, no LOC, no subsequent vomiting.  Not on thinners.  Broke his fall with his bilateral hands, does have a laceration to his right pinky finger and some associated pain. -Has otherwise been in his usual state of health.  No recent dizziness, infectious symptoms. - RHD - unsure date last tdap     Physical Exam   Triage Vital Signs: BP (!) 168/80 (BP Location: Left Arm)   Pulse 91   Temp 99 F (37.2 C) (Oral)   Resp 20   SpO2 100%     Most recent vital signs: Vitals:   12/08/23 2055  BP: (!) 168/80  Pulse: 91  Resp: 20  Temp: 99 F (37.2 C)  SpO2: 100%     General: Awake, no distress.  HEENT: Abrasion and skin avulsion to right forehead with some scattered pebbles in the wound.  Hemostatic.  No midline neck pain. CV:  Good peripheral perfusion. RRR, RP 2+ Resp:  Normal effort. CTAB Abd:  No distention. Nontender to deep palpation throughout Back:  No midline pain Pelvis:  Stable, no ttp BUE:  Scattered abrasions to right forearm and dorsum of right hand.  No tenderness palpation throughout right upper extremity full range of motion of all joints.  Left upper extremity with laceration to distal fifth digit, full nailbed avulsion, about 2 cm in length, extends over dorsal medial and palmar aspect of  the digit, about two thirds of circumference.  Able to flex and extend at DIP and PIP.  No pulsatile bleeding.  No exposed bone on initial eval, no visible tendons.  Able to flex and extend digit. BLE: Full range of motion of all joints, no tenderness palpation throughout, no wounds, no deformities   ED Results / Procedures / Treatments   Labs (all labs ordered are listed, but only abnormal results are displayed) Labs Reviewed - No data to display   EKG  N/a   RADIOLOGY Radiology interpreted myself and radiology reports reviewed.  Distal tuft fracture fifth digit.  No other acute pathology identified.  No foreign bodies appreciated.    PROCEDURES:  Critical Care performed: No  .Laceration Repair  Date/Time: 12/08/2023 11:21 PM  Performed by: Clarine Ozell LABOR, MD Authorized by: Clarine Ozell LABOR, MD   Consent:    Consent obtained:  Verbal   Consent given by:  Patient   Risks, benefits, and alternatives were discussed: yes     Risks discussed:  Infection, pain, retained foreign body, tendon damage, poor wound healing, poor cosmetic result, vascular damage, need for additional repair and nerve damage   Alternatives discussed:  No treatment and delayed treatment Universal protocol:    Patient identity confirmed:  Verbally with patient and arm band Anesthesia:    Anesthesia method:  Nerve block   Block location:  L 5th digital   Block needle gauge:  25 G   Block anesthetic:  Lidocaine  1% w/o epi   Block injection procedure:  Anatomic landmarks identified, anatomic landmarks palpated, negative aspiration for blood, incremental injection and introduced needle   Block outcome:  Anesthesia achieved Laceration details:    Length (cm):  2 Pre-procedure details:    Preparation:  Patient was prepped and draped in usual sterile fashion and imaging obtained to evaluate for foreign bodies Exploration:    Hemostasis achieved with:  Direct pressure   Imaging obtained: x-ray     Imaging  outcome: foreign body not noted     Wound exploration: wound explored through full range of motion and entire depth of wound visualized     Wound extent: underlying fracture     Wound extent: no foreign body, no nerve damage, no tendon damage and no vascular damage     Contaminated: no   Treatment:    Area cleansed with:  Saline   Amount of cleaning:  Extensive   Irrigation volume:  500   Irrigation method:  Pressure wash   Visualized foreign bodies/material removed: no     Debridement:  Minimal   Scar revision: yes   Skin repair:    Repair method:  Sutures   Suture size:  5-0   Suture material:  Nylon   Suture technique:  Simple interrupted   Number of sutures:  4 Approximation:    Approximation:  Loose Repair type:    Repair type:  Intermediate Post-procedure details:    Dressing:  Antibiotic ointment and non-adherent dressing   Procedure completion:  Tolerated well, no immediate complications    MEDICATIONS ORDERED IN ED: Medications  lidocaine  (PF) (XYLOCAINE ) 1 % injection 5 mL (5 mLs Other Given by Other 12/08/23 2152)  Tdap (BOOSTRIX) injection 0.5 mL (0.5 mLs Intramuscular Given 12/08/23 2152)  cephALEXin  (KEFLEX ) capsule 500 mg (500 mg Oral Given 12/08/23 2251)  bacitracin  ointment ( Topical Given 12/08/23 2324)     IMPRESSION / MDM / ASSESSMENT AND PLAN / ED COURSE  I reviewed the triage vital signs and the nursing notes.                              DDX/MDM/AP: Differential diagnosis includes, but is not limited to, left fifth finger laceration with nailbed avulsion, consider underlying fracture, no clear evidence of tendinous injury at this time.  Consider intracranial hemorrhage, skull fracture, C-spine fracture.  Doubt rib fractures or pneumothorax, doubt pelvic fracture.  Patient does give a very mechanical history for fall, do not suspect organic contributing component.  Plan: - N.p.o. - CT head, CT C-spine - X-ray left hand, XR pelvis, chest x-ray -  Offered Tylenol , patient declined - Will update Tdap - Laceration repair  Patient's presentation is most consistent with acute presentation with potential threat to life or bodily function.   ED course below.  Workup with left fifth digit distal tuft fracture, otherwise unremarkable.  No exposed bone appreciated.  Wound irrigated extensively after digital block, no foreign bodies appreciated.  Closed loosely with a total of 4 sutures (5-0, nylon) including anchoring through nail.  Bacitracin  and Vaseline gauze applied, dry dressings on outside, removable aluminum splint.  Plan for Tylenol  for pain control, denies need for narcotics.  Received single dose of Keflex  here in emergency department and discharged with further 5 days coordination with orthopedic service with whom I discussed this case  and will follow-up with patient as outpatient -- referral placed.  Patient and family agree with plan.  All questions answered.  ED return precautions in place.  Clinical Course as of 12/08/23 2339  Mon Dec 08, 2023  2204 [facility], CTCspine: IMPRESSION: CT of the head: No acute intracranial abnormality noted.  CT of the cervical spine: Degenerative change without acute abnormality.   [MM]  2233 XR L hand: IMPRESSION: Comminuted crush type fracture of the distal phalangeal tuft of the fifth finger with mild distraction of fracture fragments.   [MM]  2233 XR L hip, CXR: IMPRESSION: No acute bony abnormalities. Degenerative changes in the left hip. Right total hip arthroplasty.   [MM]  2235 Ortho paged [MM]  2238 Case d/w Dr. Edie of ortho Agrees w/ aggressive irrigation, loose closure, oral antibiotics, outpatient follow-up  Will also provide splint [MM]    Clinical Course User Index [MM] Clarine Ozell LABOR, MD     FINAL CLINICAL IMPRESSION(S) / ED DIAGNOSES   Final diagnoses:  Fall, initial encounter  Abrasion of forehead, initial encounter  Laceration of left little finger without  foreign body with damage to nail, initial encounter  Nondisplaced fracture of distal phalanx of left little finger, initial encounter for open fracture     Rx / DC Orders   ED Discharge Orders          Ordered    cephALEXin  (KEFLEX ) 500 MG capsule  2 times daily        12/08/23 2337    Ambulatory referral to Orthopedic Surgery       Comments: Left 5th finger laceration/fracture   12/08/23 2337    acetaminophen  (TYLENOL ) 500 MG tablet  Every 6 hours PRN        12/08/23 2337             Note:  This document was prepared using Dragon voice recognition software and may include unintentional dictation errors.   Clarine Ozell LABOR, MD 12/08/23 2337    Clarine Ozell LABOR, MD 12/08/23 [phone]

## 2023-12-08 NOTE — Discharge Instructions (Addendum)
 Your evaluation in the emergency department was notable for a fracture of your left little finger, and this did have an overlying cut that was closed with 4 sutures.  It started you on antibiotics to prevent infection, I have placed a referral for you to follow-up in orthopedic clinic--they will contact you to schedule appointment.  You can use Tylenol  as needed for any pain control.  You can also follow-up with your primary care provider in addition to recheck your wound.  Return to the emergency department with any new or worsening symptoms.

## 2023-12-08 NOTE — ED Triage Notes (Signed)
 Patient arrives via EMS from home for mechanical fall. Patient reports falling while taking trash can out and has laceration above right eyebrow, laceration to left pinky finger, and abrasions to right arm. Denies LOC, denies blood thinners.

## 2023-12-09 ENCOUNTER — Encounter: Payer: Self-pay | Admitting: Emergency Medicine

## 2023-12-09 ENCOUNTER — Ambulatory Visit: Admission: EM | Admit: 2023-12-09 | Discharge: 2023-12-09 | Disposition: A | Discharge status: Home / Self Care

## 2023-12-09 DIAGNOSIS — S0081XA Abrasion of other part of head, initial encounter: Secondary | ICD-10-CM | POA: Diagnosis not present

## 2023-12-09 NOTE — ED Provider Notes (Signed)
 [facility]    CSN: [id number] Arrival date & time: 12/09/23  1412      History   Chief Complaint Chief Complaint  Patient presents with   Wound Check    HPI Lessie Barfoot is a 78 y.o. male.   Patient presents for eval ration for a wound check to an abrasion to his forehead that occurred 1 day ago after fall.  Initially evaluated in the emergency department where wound was cleansed and dressing applied however overnight dressing came off and site began to bleed.  Wife attempted to redress with nonadherent Band-Aid.  Unsure if done correctly.  Past Medical History:  Diagnosis Date   Arthritis    generalized   Complication of anesthesia    Altered mental status (over 15 years ago). No issues recently.   Diabetes mellitus without complication (HCC)    Hemochromatosis    High cholesterol    Hypertension    Myocardial infarction (HCC) 1990   Sleep apnea    uses CPAP    There are no active problems to display for this patient.   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 Authorizing Provider  acetaminophen  (TYLENOL ) 500 MG tablet Take 2 tablets (1,000 mg total) by mouth every 6 (six) hours as needed. 12/08/23 12/07/24  Clarine Ozell LABOR, MD  carvedilol (COREG) 3.125 MG tablet Take 3.125 mg by mouth 2 (two) times daily with a meal.    [provider]  cephALEXin  (KEFLEX ) 500 MG capsule Take 1 capsule (500 mg total) by mouth 2  (two) times daily for 5 days. 12/09/23 12/14/23  Clarine Ozell LABOR, MD  FLUoxetine (PROZAC) 10 MG tablet Take 10 mg by mouth daily. AM    [provider]  glimepiride (AMARYL) 4 MG tablet Take 4 mg by mouth daily with breakfast.    [provider]  lisinopril (PRINIVIL,ZESTRIL) 10 MG tablet Take 10 mg by mouth daily. AM    [provider]  metFORMIN (GLUCOPHAGE-XR) 750 MG 24 hr tablet Take 1,500 mg by mouth daily. PM    [provider]  nitroGLYCERIN (NITROSTAT) 0.4 MG SL tablet Place 0.4 mg under the tongue every 5 (five) minutes as needed for chest pain.    [provider]  oxyCODONE -acetaminophen  (ROXICET) 5-325 MG per tablet Take 1 tablet by mouth every 6 (six) hours as needed for moderate pain or severe pain. 08/20/14   Menshew, Candida LULLA Kings, PA-C  pioglitazone (ACTOS) 30 MG tablet Take 30 mg by mouth daily. AM    [provider]  rosuvastatin (CRESTOR) 20 MG tablet Take 20 mg by mouth daily. PM    [provider]    Family History History reviewed. No pertinent family history.  Social History Social History   Tobacco Use   Smoking status: Never   Smokeless tobacco: Never  Substance Use Topics   Alcohol use: No   Drug use: No  Allergies   Sugar-protein-starch   Review of Systems Review of Systems   Physical Exam Triage Vital Signs ED Triage Vitals  Encounter Vitals Group     BP 12/09/23 1427 112/66     Girls Systolic BP Percentile --      Girls Diastolic BP Percentile --      Boys Systolic BP Percentile --      Boys Diastolic BP Percentile --      Pulse Rate 12/09/23 1427 78     Resp 12/09/23 1427 20     Temp 12/09/23 1427 98.5 F (36.9 C)     Temp Source 12/09/23 1427 Oral     SpO2 12/09/23 1427 100 %     Weight --      Height --      Head Circumference --      Peak Flow --      Pain Score 12/09/23 1424 0     Pain Loc --      Pain Education --      Exclude from Growth Chart --    No data  found.  Updated Vital Signs BP 112/66 (BP Location: Left Arm)   Pulse 78   Temp 98.5 F (36.9 C) (Oral)   Resp 20   SpO2 100%   Visual Acuity Right Eye Distance:   Left Eye Distance:   Bilateral Distance:    Right Eye Near:   Left Eye Near:    Bilateral Near:     Physical Exam Constitutional:      Appearance: Normal appearance.  HENT:     Head:      Comments: 2 x 3 cm abrasion present to the right side of the forehead above the right eyebrow Eyes:     Extraocular Movements: Extraocular movements intact.  Pulmonary:     Effort: Pulmonary effort is normal.  Neurological:     Mental Status: He is alert and oriented to person, place, and time. Mental status is at baseline.      UC Treatments / Results  Labs (all labs ordered are listed, but only abnormal results are displayed) Labs Reviewed - No data to display  EKG   Radiology DG Hand Complete Left Result Date: 12/08/2023 CLINICAL DATA:  Trauma with left fifth finger distal laceration after a fall. EXAM: LEFT HAND - COMPLETE 3+ VIEW COMPARISON:  None Available. FINDINGS: Overlying bandage material limits evaluation. Comminuted crush type fracture of the distal phalangeal tuft of the fifth finger with mild distraction of fracture fragments. No other acute fractures identified. No dislocation. Degenerative changes in the interphalangeal joints. No radiopaque soft tissue foreign bodies are demonstrated. IMPRESSION: Comminuted crush type fracture of the distal phalangeal tuft of the fifth finger with mild distraction of fracture fragments. Electronically Signed   By: Elsie Gravely M.D.   On: 12/08/2023 22:23   DG Pelvis Portable Result Date: 12/08/2023 CLINICAL DATA:  Trauma.  Multiple abrasions after a fall. EXAM: PORTABLE PELVIS 1-2 VIEWS COMPARISON:  Right hip 10/05/2007 FINDINGS: Postoperative right total hip arthroplasty using non cemented components. Femoral prosthesis is not entirely included within the field of  view but visualized prostheses appears well seated. No evidence of acute fracture or dislocation. SI joints and symphysis pubis are not displaced. Prominent degenerative changes in the left hip. Soft tissues are unremarkable. IMPRESSION: No acute bony abnormalities. Degenerative changes in the left hip. Right total hip arthroplasty. Electronically Signed   By: Elsie Gravely M.D.   On: 12/08/2023 22:21  DG Chest Portable 1 View Result Date: 12/08/2023 CLINICAL DATA:  Trauma. Multiple abrasions after a fall while taking out the trash. EXAM: PORTABLE CHEST 1 VIEW COMPARISON:  07/29/2022 FINDINGS: Postoperative changes in the mediastinum. Cardiac enlargement. No vascular congestion, edema, or consolidation. No pleural effusion or pneumothorax. Mediastinal contours appear intact. IMPRESSION: Cardiac enlargement.  No evidence of active pulmonary disease. Electronically Signed   By: Elsie Gravely M.D.   On: 12/08/2023 22:19   CT HEAD WO CONTRAST ( ) Result Date: 12/08/2023 CLINICAL DATA:  Recent fall with headaches and neck pain, initial encounter EXAM: CT HEAD WITHOUT CONTRAST CT CERVICAL SPINE WITHOUT CONTRAST TECHNIQUE: Multidetector CT imaging of the head and cervical spine was performed following the standard protocol without intravenous contrast. Multiplanar CT image reconstructions of the cervical spine were also generated. RADIATION DOSE REDUCTION: This exam was performed according to the departmental dose-optimization program which includes automated exposure control, adjustment of the mA and/or kV according to patient size and/or use of iterative reconstruction technique. COMPARISON:  08/20/2014 FINDINGS: CT HEAD FINDINGS Brain: No evidence of acute infarction, hemorrhage, hydrocephalus, extra-axial collection or mass lesion/mass effect. Vascular: No hyperdense vessel or unexpected calcification. Skull: Normal. Negative for fracture or focal lesion. Sinuses/Orbits: No acute finding. Other: None. CT  CERVICAL SPINE FINDINGS Alignment: Within normal limits. Skull base and vertebrae: 7 cervical segments are well visualized. Multilevel facet hypertrophic changes and osteophytic changes are seen. No compression deformity is noted. 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 No acute fracture or acute facet abnormality is noted. The odontoid is within normal limits. Soft tissues and spinal canal: Surrounding soft tissue structures are unremarkable. Upper chest: Visualized lung apices are within normal limits. Other: None IMPRESSION: CT of the head: No acute intracranial abnormality noted. CT of the cervical spine: Degenerative change without acute abnormality. Electronically Signed   By: Oneil Devonshire M.D.   On: 12/08/2023 21:56   CT Cervical Spine Wo Contrast Result Date: 12/08/2023 CLINICAL DATA:  Recent fall with headaches and neck pain, initial encounter EXAM: CT HEAD WITHOUT CONTRAST CT CERVICAL SPINE WITHOUT CONTRAST TECHNIQUE: Multidetector CT imaging of the head and cervical spine was performed following the standard protocol without intravenous contrast. Multiplanar CT image reconstructions of the cervical spine were also generated. RADIATION DOSE REDUCTION: This exam was performed according to the departmental dose-optimization program which includes automated exposure control, adjustment of the mA and/or kV according to patient size and/or use of iterative reconstruction technique. COMPARISON:  08/20/2014 FINDINGS: CT HEAD FINDINGS Brain: No evidence of acute infarction, hemorrhage, hydrocephalus, extra-axial collection or mass lesion/mass effect. Vascular: No hyperdense vessel or unexpected calcification. Skull: Normal. Negative for fracture or focal lesion. Sinuses/Orbits: No acute finding. Other: None. CT CERVICAL SPINE FINDINGS Alignment: Within normal limits. Skull base and vertebrae: 7 cervical segments are well visualized. Multilevel facet hypertrophic changes and osteophytic  changes are seen. No compression deformity is noted. 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 No acute fracture or acute facet abnormality is noted. The odontoid is within normal limits. Soft tissues and spinal canal: Surrounding soft tissue structures are unremarkable. Upper chest: Visualized lung apices are within normal limits. Other: None IMPRESSION: CT of the head: No acute intracranial abnormality noted. CT of the cervical spine: Degenerative change without acute abnormality. Electronically Signed   By: Oneil Devonshire M.D.   On: 12/08/2023 21:56    Procedures Procedures (including critical care time)  Medications Ordered in UC Medications - No data to  display  Initial Impression / Assessment and Plan / UC Course  I have reviewed the triage vital signs and the nursing notes.  Pertinent labs & imaging results that were available during my care of the patient were reviewed by me and considered in my medical decision making (see chart for details).  Abrasion to forehead, initial encounter  Wound cleanse with chlorhexidine and sterile water, nonadherent dressing applied, encouraged patient to pick up prescription for cephalexin  and begin use, recommended daily cleaning with unscented soap and water, padding and not rubbing the area affected area, advised to cover with a nonadherent dressing until scabbed, may take Tylenol  if needed for pain and advised follow-up for any delays in healing Final Clinical Impressions(s) / UC Diagnoses   Final diagnoses:  Abrasion of forehead, initial encounter     Discharge Instructions      Today he was evaluated with a wound to his forehead which has been cleaned and redressed here in the clinic  Clean the wound daily  At home you may use unscented soap such as Dial and water to clean over the area during normal hygiene, pat and do not rub  Then you may apply a nonstick dressing similar to the one that you were currently  using  Pick up antibiotic from the pharmacy and begin use as this will keep area from becoming infected  Wound care outside of normal hygiene does not need to be completed to the arm as the area has been closed and still with scabbing and dried blood  If you have any concerns about healing please follow-up for reevaluation   ED Prescriptions   None    PDMP not reviewed this encounter.   Teresa Shelba SAUNDERS, NP 12/09/23 1535

## 2023-12-09 NOTE — Discharge Instructions (Addendum)
 Today he was evaluated with a wound to his forehead which has been cleaned and redressed here in the clinic  Clean the wound daily  At home you may use unscented soap such as Dial and water to clean over the area during normal hygiene, pat and do not rub  Then you may apply a nonstick dressing similar to the one that you were currently using  Pick up antibiotic from the pharmacy and begin use as this will keep area from becoming infected  Wound care outside of normal hygiene does not need to be completed to the arm as the area has been closed and still with scabbing and dried blood  If you have any concerns about healing please follow-up for reevaluation

## 2023-12-09 NOTE — ED Notes (Signed)
 Wounds dressed and cleaned with bacitracin .  D/c inst to pt.  Pt alert.

## 2023-12-09 NOTE — ED Triage Notes (Signed)
 Patient was deen in ER last night for a fall with laceration. Wife is concerned about bleeding from the forehead. Bleeding controlled at this time with large  Band-Aid. Denies pain at this time.
